# Patient Record
Sex: Female | Born: 1970 | Race: White | Hispanic: No | Marital: Married | Smoking: Former smoker
Health system: Southern US, Community
[De-identification: ages and names within clinical notes are randomized; demographics above are authoritative.]

## PROBLEM LIST (undated history)

## (undated) DIAGNOSIS — I10 Essential (primary) hypertension: Secondary | ICD-10-CM

## (undated) DIAGNOSIS — E119 Type 2 diabetes mellitus without complications: Secondary | ICD-10-CM

## (undated) DIAGNOSIS — N200 Calculus of kidney: Secondary | ICD-10-CM

## (undated) DIAGNOSIS — G35 Multiple sclerosis: Secondary | ICD-10-CM

## (undated) HISTORY — PX: TUBAL LIGATION: SHX77

---

## 2005-12-28 ENCOUNTER — Emergency Department: Payer: Self-pay | Admitting: Emergency Medicine

## 2008-10-30 ENCOUNTER — Emergency Department: Payer: Self-pay | Admitting: Emergency Medicine

## 2008-12-05 ENCOUNTER — Emergency Department: Payer: Self-pay | Admitting: Emergency Medicine

## 2009-08-16 ENCOUNTER — Emergency Department: Payer: Self-pay | Admitting: Emergency Medicine

## 2010-12-28 ENCOUNTER — Emergency Department: Payer: Self-pay | Admitting: *Deleted

## 2011-01-11 ENCOUNTER — Emergency Department: Payer: Self-pay | Admitting: Emergency Medicine

## 2013-03-10 HISTORY — PX: CARPAL TUNNEL RELEASE: SHX101

## 2014-04-25 ENCOUNTER — Encounter: Payer: Self-pay | Admitting: Podiatry

## 2014-04-25 ENCOUNTER — Ambulatory Visit (INDEPENDENT_AMBULATORY_CARE_PROVIDER_SITE_OTHER): Payer: PRIVATE HEALTH INSURANCE | Admitting: Podiatry

## 2014-04-25 ENCOUNTER — Ambulatory Visit (INDEPENDENT_AMBULATORY_CARE_PROVIDER_SITE_OTHER): Payer: PRIVATE HEALTH INSURANCE

## 2014-04-25 VITALS — BP 153/88 | HR 83 | Resp 16

## 2014-04-25 DIAGNOSIS — M722 Plantar fascial fibromatosis: Secondary | ICD-10-CM

## 2014-04-25 MED ORDER — TRIAMCINOLONE ACETONIDE 10 MG/ML IJ SUSP
10.0000 mg | Freq: Once | INTRAMUSCULAR | Status: AC
  Administered 2014-04-25: 10 mg

## 2014-04-25 NOTE — Progress Notes (Signed)
   Subjective:    Patient ID: Taylor Wyatt, female    DOB: 27-Oct-1970, 44 y.o.   MRN: [id number]  HPI Comments: "I have plantar fasciitis"  Patient c/o aching plantar heel right for about 3 months. She has had heel pain treated in 2013. AM pain. PCP Rx'd meloxicam-no better.  Foot Pain      Review of Systems  All other systems reviewed and are negative.      Objective:   Physical Exam        Assessment & Plan:

## 2014-04-25 NOTE — Patient Instructions (Signed)

## 2014-04-26 NOTE — Progress Notes (Signed)
Subjective:     Patient ID: Taylor Wyatt, female   DOB: 1970-09-07, 44 y.o.   MRN: [id number]  HPI patient states I've had a lot of pain in the bottom of my right heel for the last 3 months and I've tried reduced activity ice therapy and supportive shoe gears   Review of Systems     Objective:   Physical Exam Neurovascular status intact with muscle strength adequate and range of motion subtalar and midtarsal joint within normal limits. Patient is noted to have intense discomfort plantar aspect right heel at the insertional point of the tendon into the calcaneus with inflammation and fluid buildup at the insertion with moderate depression of the arch noted    Assessment:     Acute plantar fascia right is right with long-term history of plantar fasciitis    Plan:     H&P and condition discussed and today I injected the right plantar fascia 3 mg Kenalog 5 mg Xylocaine and dispensed fascial brace with instructions. Also placed on oral anti-inflammatory and gave instructions for physical therapy

## 2014-04-28 ENCOUNTER — Ambulatory Visit: Payer: Self-pay | Admitting: Podiatry

## 2014-05-05 ENCOUNTER — Ambulatory Visit (INDEPENDENT_AMBULATORY_CARE_PROVIDER_SITE_OTHER): Payer: PRIVATE HEALTH INSURANCE | Admitting: Podiatry

## 2014-05-05 ENCOUNTER — Encounter: Payer: Self-pay | Admitting: Podiatry

## 2014-05-05 VITALS — BP 157/88 | HR 68 | Resp 16

## 2014-05-05 DIAGNOSIS — M722 Plantar fascial fibromatosis: Secondary | ICD-10-CM

## 2014-05-05 NOTE — Progress Notes (Signed)
Subjective:     Patient ID: Taylor Wyatt, female   DOB: 07/26/1970, 44 y.o.   MRN: [id number]  HPI patient states my right heel feels quite a bit better with mild discomfort if I do a lot of walking but overall significantly improved   Review of Systems     Objective:   Physical Exam Neurovascular status intact with significant diminishment of discomfort medial band right heel at the insertional point of the tendon into the calcaneus    Assessment:     Improved plantar fasciitis right with diminished fluid buildup and diminished discomfort     Plan:     Advised on the importance of stretching and physical therapy and at this time I discussed shoe gear alternatives. Patient will be seen back if symptoms indicate

## 2014-06-16 ENCOUNTER — Encounter: Payer: Self-pay | Admitting: Podiatry

## 2014-06-16 ENCOUNTER — Ambulatory Visit (INDEPENDENT_AMBULATORY_CARE_PROVIDER_SITE_OTHER): Payer: PRIVATE HEALTH INSURANCE | Admitting: Podiatry

## 2014-06-16 VITALS — BP 147/85 | HR 81 | Resp 16

## 2014-06-16 DIAGNOSIS — M722 Plantar fascial fibromatosis: Secondary | ICD-10-CM | POA: Diagnosis not present

## 2014-06-16 MED ORDER — TRIAMCINOLONE ACETONIDE 10 MG/ML IJ SUSP
10.0000 mg | Freq: Once | INTRAMUSCULAR | Status: AC
  Administered 2014-06-16: 10 mg

## 2014-06-16 MED ORDER — DICLOFENAC SODIUM 75 MG PO TBEC: 75.0000 mg | DELAYED_RELEASE_TABLET | Freq: Two times a day (BID) | ORAL | Status: AC

## 2014-06-18 NOTE — Progress Notes (Signed)
Subjective:     Patient ID: Taylor Wyatt, female   DOB: Nov 26, 1970, 44 y.o.   MRN: [id number]  HPI patient presents stating my right heel continues to bother me around the medial band and especially is worse after sitting or getting up in the morning   Review of Systems     Objective:   Physical Exam Vascular status intact muscle strength adequate range of motion within normal limits with patient found to have inflammation and pain around the medial band right at the insertion of the tendon into the calcaneus. Patient has moderate depression of the arch    Assessment:     Continued significant plantar fasciitis right with inflammation worse after rest    Plan:     H&P and condition discussed with patient. Today I went ahead reinjected the plantar fascia 3 Milligan Kenalog 5 mill grams Xylocaine and dispensed night splint with instructions on usage

## 2014-07-07 ENCOUNTER — Encounter: Payer: Self-pay | Admitting: Podiatry

## 2014-07-07 ENCOUNTER — Ambulatory Visit (INDEPENDENT_AMBULATORY_CARE_PROVIDER_SITE_OTHER): Payer: PRIVATE HEALTH INSURANCE | Admitting: Podiatry

## 2014-07-07 VITALS — BP 149/77 | HR 68 | Resp 16

## 2014-07-07 DIAGNOSIS — M722 Plantar fascial fibromatosis: Secondary | ICD-10-CM | POA: Diagnosis not present

## 2014-07-08 NOTE — Progress Notes (Signed)
Subjective:     Patient ID: Taylor Wyatt, female   DOB: 04/19/1970, 44 y.o.   MRN: [id number]  HPI patient states that her heel has improved quite a bit   Review of Systems     Objective:   Physical Exam Neurovascular status intact with diminishment of discomfort plantar aspect right heel at the insertional point of the tendon into the calcaneus    Assessment:     Plantar fasciitis right improved    Plan:     Advised on physical therapy anti-inflammatories and shoe gear modification. Reappoint as needed

## 2014-11-14 ENCOUNTER — Emergency Department: Payer: No Typology Code available for payment source

## 2014-11-14 ENCOUNTER — Emergency Department (HOSPITAL_COMMUNITY): Payer: Self-pay

## 2014-11-14 ENCOUNTER — Emergency Department
Admission: EM | Admit: 2014-11-14 | Discharge: 2014-11-14 | Disposition: A | Discharge status: Home / Self Care | Payer: No Typology Code available for payment source | Attending: Emergency Medicine | Admitting: Emergency Medicine

## 2014-11-14 ENCOUNTER — Encounter: Payer: Self-pay | Admitting: Intensive Care

## 2014-11-14 DIAGNOSIS — Z87891 Personal history of nicotine dependence: Secondary | ICD-10-CM | POA: Insufficient documentation

## 2014-11-14 DIAGNOSIS — Y92410 Unspecified street and highway as the place of occurrence of the external cause: Secondary | ICD-10-CM | POA: Insufficient documentation

## 2014-11-14 DIAGNOSIS — I10 Essential (primary) hypertension: Secondary | ICD-10-CM | POA: Insufficient documentation

## 2014-11-14 DIAGNOSIS — Y998 Other external cause status: Secondary | ICD-10-CM | POA: Diagnosis not present

## 2014-11-14 DIAGNOSIS — S161XXA Strain of muscle, fascia and tendon at neck level, initial encounter: Secondary | ICD-10-CM | POA: Diagnosis not present

## 2014-11-14 DIAGNOSIS — Z792 Long term (current) use of antibiotics: Secondary | ICD-10-CM | POA: Insufficient documentation

## 2014-11-14 DIAGNOSIS — Y9389 Activity, other specified: Secondary | ICD-10-CM | POA: Insufficient documentation

## 2014-11-14 DIAGNOSIS — S199XXA Unspecified injury of neck, initial encounter: Secondary | ICD-10-CM | POA: Diagnosis present

## 2014-11-14 HISTORY — DX: Calculus of kidney: N20.0

## 2014-11-14 HISTORY — DX: Essential (primary) hypertension: I10

## 2014-11-14 MED ORDER — ACETAMINOPHEN 500 MG PO TABS
1000.0000 mg | ORAL_TABLET | ORAL | Status: AC
  Administered 2014-11-14: 1000 mg via ORAL
  Filled 2014-11-14: qty 2

## 2014-11-14 MED ORDER — CYCLOBENZAPRINE HCL 10 MG PO TABS: 10.0000 mg | ORAL_TABLET | Freq: Three times a day (TID) | ORAL | Status: AC | PRN
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
## 2018-02-25 ENCOUNTER — Emergency Department: Payer: 59

## 2018-02-25 ENCOUNTER — Encounter: Payer: Self-pay | Admitting: Emergency Medicine

## 2018-02-25 ENCOUNTER — Emergency Department
Admission: EM | Admit: 2018-02-25 | Discharge: 2018-02-25 | Disposition: A | Discharge status: Home / Self Care | Payer: 59 | Attending: Emergency Medicine | Admitting: Emergency Medicine

## 2018-02-25 ENCOUNTER — Other Ambulatory Visit: Payer: Self-pay

## 2018-02-25 DIAGNOSIS — W182XXA Fall in (into) shower or empty bathtub, initial encounter: Secondary | ICD-10-CM | POA: Diagnosis not present

## 2018-02-25 DIAGNOSIS — Y9389 Activity, other specified: Secondary | ICD-10-CM | POA: Diagnosis not present

## 2018-02-25 DIAGNOSIS — I10 Essential (primary) hypertension: Secondary | ICD-10-CM | POA: Insufficient documentation

## 2018-02-25 DIAGNOSIS — Y929 Unspecified place or not applicable: Secondary | ICD-10-CM | POA: Diagnosis not present

## 2018-02-25 DIAGNOSIS — S0083XA Contusion of other part of head, initial encounter: Secondary | ICD-10-CM | POA: Insufficient documentation

## 2018-02-25 DIAGNOSIS — Y999 Unspecified external cause status: Secondary | ICD-10-CM | POA: Diagnosis not present

## 2018-02-25 DIAGNOSIS — Z87891 Personal history of nicotine dependence: Secondary | ICD-10-CM | POA: Diagnosis not present

## 2018-02-25 DIAGNOSIS — S0993XA Unspecified injury of face, initial encounter: Secondary | ICD-10-CM | POA: Diagnosis present

## 2018-02-25 MED ORDER — MELOXICAM 15 MG PO TABS: 15.0000 mg | ORAL_TABLET | Freq: Every day | ORAL | 0 refills | Status: AC

## 2018-02-25 NOTE — ED Notes (Signed)
Patient reports falling in shower on Tuesday, hitting the left side of her face and left shoulder. Denies LOC. Denies visual disturbance or pain with eye movement. Dark purple bruising noted around left eye. Patient able to move left arm freely.

## 2018-02-25 NOTE — ED Triage Notes (Signed)
Pt had mechanical fall in the shower on Tuesday. Pt denies loc. [facility]. [facility] sent pt to ED to have CT of the face to assess for broken bones.

## 2018-02-25 NOTE — ED Notes (Signed)
Patient discharged to home per MD order. Patient in stable condition, and deemed medically cleared by ED provider for discharge. Discharge instructions reviewed with patient/family using "Teach Back"; verbalized understanding of medication education and administration, and information about follow-up care. Denies further concerns. ° °

## 2018-02-25 NOTE — ED Provider Notes (Signed)
[facility] Emergency Department Provider Note  ____________________________________________  Time seen: Approximately 7:41 PM  I have reviewed the triage vital signs and the nursing notes.   HISTORY  Chief Complaint Fall    HPI Taylor Wyatt is a 47 y.o. female presents to the emergency department with ecchymosis around left eye after patient reportedly tripped and fell in the shower 3 days ago.  Patient did not lose consciousness.  She has not been experiencing any pain with extraocular eye muscle movements.  She denies current headache or neck pain.  No changes in vision.  No numbness or tingling in the upper or lower extremities.  She denies chest tightness, chest pain, shortness of breath, nausea, vomiting abdominal pain.  She has had no difficulty ambulating.  Patient has had facial tenderness and became concerned for fracture.  She was referred to the ED from urgent care.   Past Medical History:  Diagnosis Date  . Hypertension   . Kidney stones     There are no active problems to display for this patient.   Past Surgical History:  Procedure Laterality Date  . CESAREAN SECTION    . TUBAL LIGATION      Prior to Admission medications   Medication Sig Start Date End Date Taking? Authorizing Provider  diclofenac (VOLTAREN) 75 MG EC tablet Take 1 tablet (75 mg total) by mouth 2 (two) times daily. 06/16/14   Lenn Sinkegal, Norman S, DPM  meloxicam (MOBIC) 15 MG tablet Take 1 tablet (15 mg total) by mouth daily. 02/25/18 02/25/19  Orvil FeilWoods, Debra Calabretta M, PA-C    Allergies Lisinopril  No family history on file.  Social History Social History   Tobacco Use  . Smoking status: Former Games developermoker  . Smokeless tobacco: Never Used  Substance Use Topics  . Alcohol use: No    Alcohol/week: 0.0 standard drinks  . Drug use: No     Review of Systems  Constitutional: No fever/chills Eyes: No visual changes. No discharge ENT: No upper respiratory  complaints. Cardiovascular: no chest pain. Respiratory: no cough. No SOB. Gastrointestinal: No abdominal pain.  No nausea, no vomiting.  No diarrhea.  No constipation. Genitourinary: Negative for dysuria. No hematuria Musculoskeletal: Negative for musculoskeletal pain. Skin: Negative for rash, abrasions, lacerations, ecchymosis. Neurological: Negative for headaches, focal weakness or numbness.   ____________________________________________   PHYSICAL EXAM:  VITAL SIGNS: ED Triage Vitals  Enc Vitals Group     BP 02/25/18 1740 (!) 150/78     Pulse Rate 02/25/18 1740 75     Resp 02/25/18 1740 18     Temp 02/25/18 1740 98.4 F (36.9 C)     Temp Source 02/25/18 1740 Oral     SpO2 02/25/18 1740 98 %     Weight 02/25/18 1741 238 lb (108 kg)     Height 02/25/18 1741 5\' 6"  (1.676 m)     Head Circumference --      Peak Flow --      Pain Score 02/25/18 1741 3     Pain Loc --      Pain Edu? --      Excl. in GC? --      Constitutional: Alert and oriented. Well appearing and in no acute distress. Eyes: Conjunctivae are normal. PERRL. EOMI. no pain is elicited with extraocular eye muscle movement.  Patient does have tenderness with palpation of the left inferior orbit and zygomatic arch. Head: Atraumatic.  Patient has facial bruising. ENT:      Ears:  TMs are pearly.      Nose: No congestion/rhinnorhea.      Mouth/Throat: Mucous membranes are moist.  Neck: No stridor.  No cervical spine tenderness to palpation. Hematological/Lymphatic/Immunilogical: No cervical lymphadenopathy. Cardiovascular: Normal rate, regular rhythm. Normal S1 and S2.  Good peripheral circulation. Respiratory: Normal respiratory effort without tachypnea or retractions. Lungs CTAB. Good air entry to the bases with no decreased or absent breath sounds. Gastrointestinal: Bowel sounds 4 quadrants. Soft and nontender to palpation. No guarding or rigidity. No palpable masses. No distention. No CVA  tenderness. Musculoskeletal: Full range of motion to all extremities. No gross deformities appreciated. Neurologic:  Normal speech and language. No gross focal neurologic deficits are appreciated.  Skin:  Skin is warm, dry and intact. No rash noted. Psychiatric: Mood and affect are normal. Speech and behavior are normal. Patient exhibits appropriate insight and judgement.   ____________________________________________   LABS (all labs ordered are listed, but only abnormal results are displayed)  Labs Reviewed - No data to display ____________________________________________  EKG   ____________________________________________  RADIOLOGY I personally viewed and evaluated these images as part of my medical decision making, as well as reviewing the written report by the radiologist.  Ct Maxillofacial Wo Contrast  Result Date: 02/25/2018 CLINICAL DATA:  47 y/o  F; fall with injury to left eye. EXAM: CT MAXILLOFACIAL WITHOUT CONTRAST TECHNIQUE: Multidetector CT imaging of the maxillofacial structures was performed. Multiplanar CT image reconstructions were also generated. COMPARISON:  None. FINDINGS: Osseous: No fracture or mandibular dislocation. No destructive process. Orbits: No traumatic or inflammatory finding of the orbital compartments. Sinuses: Clear. Soft tissues: Edema of the superficial left periorbital and facial soft tissues compatible with contusion. No hematoma. Limited intracranial: No significant or unexpected finding. IMPRESSION: Left periorbital and facial superficial soft tissue contusion. No hematoma. No facial fracture. Electronically Signed   By: Mitzi HansenLance  Furusawa-Stratton M.D.   On: 02/25/2018 18:35    ____________________________________________    PROCEDURES  Procedure(s) performed:    Procedures    Medications - No data to display   ____________________________________________   INITIAL IMPRESSION / ASSESSMENT AND PLAN / ED COURSE  Pertinent labs &  imaging results that were available during my care of the patient were reviewed by me and considered in my medical decision making (see chart for details).  Review of the [place] CSRS was performed in accordance of the [facility] prior to dispensing any controlled drugs.      Assessment and Plan:  Facial contusion Patient presents to the emergency department with left-sided facial pain after a fall that occurred 3 days ago.  CT maxillofacial revealed no acute abnormality.  Overall physical exam was reassuring.  Ice application was recommended.  Patient was discharged with meloxicam after she assured me that she has no history of prior GI bleed.  Vital signs were reassuring prior to discharge.  All patient questions were answered.  ____________________________________________  FINAL CLINICAL IMPRESSION(S) / ED DIAGNOSES  Final diagnoses:  Contusion of face, initial encounter      NEW MEDICATIONS STARTED DURING THIS VISIT:  ED Discharge Orders         Ordered    meloxicam (MOBIC) 15 MG tablet  Daily     02/25/18 1914              This chart was dictated using voice recognition software/Dragon. Despite best efforts to proofread, errors can occur which can change the meaning. Any change was purely unintentional.    Orvil FeilWoods, Adia Crammer M, PA-C 02/25/18  4098    Arnaldo Natal, MD 02/26/18 2306

## 2018-05-19 ENCOUNTER — Other Ambulatory Visit: Payer: Self-pay

## 2018-05-19 ENCOUNTER — Ambulatory Visit
Payer: Managed Care, Other (non HMO) | Attending: Neurology | Admitting: Rehabilitative and Restorative Service Providers"

## 2018-05-19 DIAGNOSIS — M6281 Muscle weakness (generalized): Secondary | ICD-10-CM | POA: Diagnosis present

## 2018-05-19 DIAGNOSIS — R2689 Other abnormalities of gait and mobility: Secondary | ICD-10-CM | POA: Diagnosis not present

## 2018-05-19 DIAGNOSIS — R29818 Other symptoms and signs involving the nervous system: Secondary | ICD-10-CM | POA: Diagnosis present

## 2018-05-19 NOTE — Patient Instructions (Signed)
Access Code: [email or web address]  URL: [email or web address]  Date: 05/19/2018  Prepared by: Margretta Ditty   Exercises Standing Gastroc Stretch - 3 reps - 1 sets - 30 seconds hold - 2x daily - 7x weekly Seated Hamstring Stretch with Chair - 3 reps - 1 sets - 30 seconds hold - 2x daily - 7x weekly

## 2018-05-20 NOTE — Therapy (Signed)
[facility] [address] [place], [place], [address] Phone: [phone]   Fax:  [phone]  Physical Therapy Evaluation  Patient Details  Name: Taylor Wyatt MRN: [id number] Date of Birth: 1970/08/10 Referring Provider (PT): Shirlean Schlein, MD   Encounter Date: 05/19/2018  PT End of Session - 05/20/18 1404    Visit Number  1    Number of Visits  12    Date for PT Re-Evaluation  07/19/18    Authorization Type  [employer] managed care *contacted patient regarding health plan as unsure if we are covered in plan    PT Start Time  0845    PT Stop Time  0930    PT Time Calculation (min)  45 min    Activity Tolerance  Patient tolerated treatment well    Behavior During Therapy  [facility] for tasks assessed/performed       Past Medical History:  Diagnosis Date  . Hypertension   . Kidney stones     Past Surgical History:  Procedure Laterality Date  . CESAREAN SECTION    . TUBAL LIGATION      There were no vitals filed for this visit.   Subjective Assessment - 05/19/18 0856    Subjective  The patient was diagnosed with MS 3-4 years ago.  Initial symptoms included hand numbness and optic neuritis.  The patient's cc:  imbalance with falls (6x in last 6 months), weakness, fatigue.   She notes when she gets hot at work, she notes she trips more (has foot drop) and this leads to falls.    She also notes a h/o plantar fascitis and gets foot pain while working.    Pertinent History  Multiple Sclerosis, carpal tunnel release surgery 2015,    How long can you walk comfortably?  Patient works for 2, 5 hour shifts during the week.      Patient Stated Goals  "I just want to be able to walk again."  She notes she worries about tripping.      Currently in Pain?  Yes    Pain Score  --   she has pain in R wrist and bilateral knees from falling last week   Aggravating Factors   worse due to falls    Pain Relieving Factors  rest,  time    Effect of Pain on Daily Activities  Also tenderness with palpation of the R hip (glut medius region).         [facility] PT Assessment - 05/19/18 0900      Assessment   Medical Diagnosis  Multiple Sclerosis, fatigue and walking instability    Referring Provider (PT)  Shirlean Schlein, MD    Onset Date/Surgical Date  --   initial onset 3-4 years ago   Hand Dominance  Right    Prior Therapy  none      Precautions   Precautions  Fall    Precaution Comments  *fatigues with heat      Restrictions   Weight Bearing Restrictions  No      Balance Screen   Has the patient fallen in the past 6 months  Yes    How many times?  6    Has the patient had a decrease in activity level because of a fear of falling?   Yes   works, but reduced hours due to weakness, falls   Is the patient reluctant to leave their home because of a fear of  falling?   No      Home Environment   Living Environment  Private residence    Living Arrangements  Spouse/significant other    Type of Home  House    Home Access  Stairs to enter    Entrance Stairs-Number of Steps  4    Entrance Stairs-Rails  Left    Home Layout  One level    Home Equipment  --   hiking sticks (hasn't used them a lot due to falls)     Prior Function   Level of Independence  Independent      Sensation   Light Touch  Impaired Detail    Light Touch Impaired Details  Impaired LLE    Additional Comments  Has bilateral foot pain-- has been treated for plantar fascitis.  She wears TENS unit to reduce foot pain.  All of her pain is in her heels.  Plantar fascitis x 10+ years, has had multiple injections.      ROM / Strength   AROM / PROM / Strength  AROM;Strength      AROM   Overall AROM Comments  WFLs      Strength   Overall Strength  Deficits    Strength Assessment Site  Shoulder;Elbow;Hand;Hip;Knee;Ankle    Right/Left Shoulder  Right;Left    Right Shoulder Flexion  5/5    Right Shoulder ABduction  5/5    Left Shoulder  Flexion  5/5    Left Shoulder ABduction  5/5    Right/Left Elbow  Right;Left    Right Elbow Flexion  5/5    Right Elbow Extension  5/5    Left Elbow Flexion  5/5    Left Elbow Extension  5/5    Right/Left hand  Right;Left    Right Hand Grip (lbs)  85    Left Hand Grip (lbs)  78    Right/Left Hip  Right;Left    Right Hip Flexion  3/5    Right Hip ABduction  3/5    Left Hip Flexion  3/5    Left Hip ABduction  2+/5    Right/Left Knee  Right;Left    Right Knee Flexion  5/5    Right Knee Extension  5/5    Left Knee Flexion  5/5    Left Knee Extension  5/5    Right/Left Ankle  Right;Left    Right Ankle Dorsiflexion  4+/5    Left Ankle Dorsiflexion  3+/5      Flexibility   Soft Tissue Assessment /Muscle Length  yes    Hamstrings  bilateral general tightness; L anterior hip capsule tightness with dec'd ROM in Faber test L, R painful in glut medius    Quadriceps  tightness noted in sidelying with hip extension + knee flexion      Ambulation/Gait   Ambulation/Gait  Yes    Ambulation/Gait Assistance  6: Modified independent (Device/Increase time)    Ambulation Distance (Feet)  200 Feet    Assistive device  None    Ambulation Surface  Level;Indoor    Gait Comments  R antalgic gait pattern, decreased bilat heel strike.       Standardized Balance Assessment   Standardized Balance Assessment  Berg Balance Test      Berg Balance Test   Sit to Stand  Able to stand without using hands and stabilize independently    Standing Unsupported  Able to stand safely 2 minutes    Sitting with Back Unsupported but Feet Supported on Floor  or Stool  Able to sit safely and securely 2 minutes    Stand to Sit  Sits safely with minimal use of hands    Transfers  Able to transfer safely, minor use of hands    Standing Unsupported with Eyes Closed  Able to stand 10 seconds with supervision    Standing Unsupported with Feet Together  Able to place feet together independently and stand for 1 minute with  supervision    From Standing, Reach Forward with Outstretched Arm  Can reach confidently >25 cm (10")    From Standing Position, Pick up Object from Floor  Able to pick up shoe safely and easily    From Standing Position, Turn to Look Behind Over each Shoulder  Looks behind from both sides and weight shifts well    Turn 360 Degrees  Needs close supervision or verbal cueing    Standing Unsupported, Alternately Place Feet on Step/Stool  Able to complete >2 steps/needs minimal assist    Standing Unsupported, One Foot in Front  Able to take small step independently and hold 30 seconds    Standing on One Leg  Tries to lift leg/unable to hold 3 seconds but remains standing independently    Total Score  43    Berg comment:  43/56                Objective measurements completed on examination: See above findings.              PT Education - 05/20/18 1357    Education Details  HEP intiiated for LE stretching.    Person(s) Educated  Patient    Methods  Explanation;Demonstration;Handout    Comprehension  Returned demonstration;Verbalized understanding       PT Short Term Goals - 05/20/18 1427      PT SHORT TERM GOAL #1   Title  The patient will be indep with HEP for LE strengthening, flexibility, balance and general conditioning.    Time  4    Period  Weeks    Target Date  06/16/18      PT SHORT TERM GOAL #2   Title  The patient will improve Berg score from 43/56 to > or equal to 48/56 to demo dec'd risk for falls.    Time  4    Period  Weeks    Target Date  06/16/18      PT SHORT TERM GOAL #3   Title  The patient will verbalize understanding of energy conservation ([employer] Society handout) for management of fatigue.    Time  4    Period  Weeks    Target Date  06/16/18      PT SHORT TERM GOAL #4   Title  The patient will be provided information on vocation rehab opportunities as she notes she would like to be able to work more, but is limited in current job.    Time   4    Period  Weeks    Target Date  06/16/18        PT Long Term Goals - 05/20/18 1430      PT LONG TERM GOAL #1   Title  The patient will be indep with progression of HEP.    Time  8    Period  Weeks    Target Date  07/14/18      PT LONG TERM GOAL #2   Title  The patient will improve Berg score from 43/56 to > or  equal to 52/56 to demo improving high level balance.    Time  8    Period  Weeks    Target Date  07/14/18      PT LONG TERM GOAL #3   Title  The patient will report ability to work x 5 hours with reduction in bilateral foot pain.    Time  8    Period  Weeks    Target Date  07/14/18      PT LONG TERM GOAL #4   Title  The patient will verbalize understanding of use of cooling vest in order to tolerate warmer environment in her work setting.    Time  8    Period  Weeks    Target Date  07/14/18      PT LONG TERM GOAL #5   Title  The patient will verbalize understanding of community wellness opportunities.    Time  8    Period  Weeks    Target Date  07/14/18             Plan - 05/20/18 1444    Clinical Impression Statement  The patient is a 48 yo female presenting to outpatient physical therapy with dx of MS for fatigue and walking instability.  The patient reports tripping and foot drop that hinder gait, worse when fatigued or overheated.  She has impaired LE strength in hips and ankles and dec'd high level balance.  In addition to MS, she notes chronic h/o plantar fasciitis.  PT to progress mobility to optimize functional status.     Personal Factors and Comorbidities  Comorbidity 1;Comorbidity 2    Comorbidities  MS, plantar fascitis    Examination-Activity Limitations  Locomotion Level    Examination-Participation Restrictions  Other;Community Activity   Work activities, continues to reduce hours   Clinical Decision Making  Moderate    Rehab Potential  Good    PT Frequency  --   2x/week x 4 weeks, 1x/week x 4 weeks   PT Treatment/Interventions   ADLs/Self Care Home Management;Therapeutic activities;Moist Heat;Therapeutic exercise;Manual techniques;Functional mobility training;Stair training;Gait training;Neuromuscular re-education;Balance training;Patient/family education;Taping;Energy conservation;Electrical Stimulation    PT Next Visit Plan  Check gait speed for baseline; Discuss cooling vest for work use due to hot environment; LE strengthening (hips and ankles).  *will consider AFOs for work if she continues with foot drop, but would rather get strengthening and cooling to avoid compensatory bracing.    Consulted and Agree with Plan of Care  Patient       Patient will benefit from skilled therapeutic intervention in order to improve the following deficits and impairments:  Abnormal gait, Decreased endurance, Decreased activity tolerance, Decreased balance, Decreased mobility, Decreased strength, Pain, Impaired flexibility  Visit Diagnosis: Other abnormalities of gait and mobility  Muscle weakness (generalized)  Other symptoms and signs involving the nervous system     Problem List There are no active problems to display for this patient.   Aaradhya Kysar, PT 05/20/2018, 3:03 PM  [facility] [address] [place], [place], [address] Phone: [phone]   Fax:  [phone]  Name: Taylor Wyatt MRN: [id number] Date of Birth: 11-Dec-1970

## 2018-05-25 ENCOUNTER — Ambulatory Visit: Payer: Managed Care, Other (non HMO) | Admitting: Physical Therapy

## 2018-05-26 ENCOUNTER — Ambulatory Visit: Payer: Managed Care, Other (non HMO) | Admitting: Physical Therapy

## 2018-06-01 ENCOUNTER — Ambulatory Visit: Payer: Managed Care, Other (non HMO) | Admitting: Physical Therapy

## 2018-06-01 ENCOUNTER — Telehealth: Payer: Self-pay | Admitting: Rehabilitative and Restorative Service Providers"

## 2018-06-01 NOTE — Telephone Encounter (Signed)
LVM:  Regarding clinic closure secondary to covid-19 Taylor Wyatt, PT

## 2018-06-03 ENCOUNTER — Ambulatory Visit: Payer: Managed Care, Other (non HMO) | Admitting: Physical Therapy

## 2018-06-08 ENCOUNTER — Ambulatory Visit: Payer: No Typology Code available for payment source | Admitting: Physical Therapy

## 2018-06-09 ENCOUNTER — Ambulatory Visit: Payer: No Typology Code available for payment source | Admitting: Rehabilitative and Restorative Service Providers"

## 2018-06-11 ENCOUNTER — Ambulatory Visit: Payer: No Typology Code available for payment source | Admitting: Physical Therapy

## 2018-06-21 ENCOUNTER — Ambulatory Visit: Payer: No Typology Code available for payment source | Admitting: Rehabilitative and Restorative Service Providers"

## 2018-06-23 ENCOUNTER — Ambulatory Visit: Payer: No Typology Code available for payment source | Admitting: Rehabilitative and Restorative Service Providers"

## 2020-03-30 IMAGING — CT CT MAXILLOFACIAL W/O CM
3 series · 15 of 47 positions shown, 18 images · non-contrast
Comparison: None.

CLINICAL DATA: 47 y/o  F; fall with injury to left eye.

EXAM:
CT MAXILLOFACIAL WITHOUT CONTRAST
TECHNIQUE: Multidetector CT imaging of the maxillofacial structures was
performed. Multiplanar CT image reconstructions were also generated.

[Series 2: max soft · axial · 0.32mm/px · z∈[-242,-106]mm · 9 of 80 slices shown, 12 images]
[im 6/80  brain]
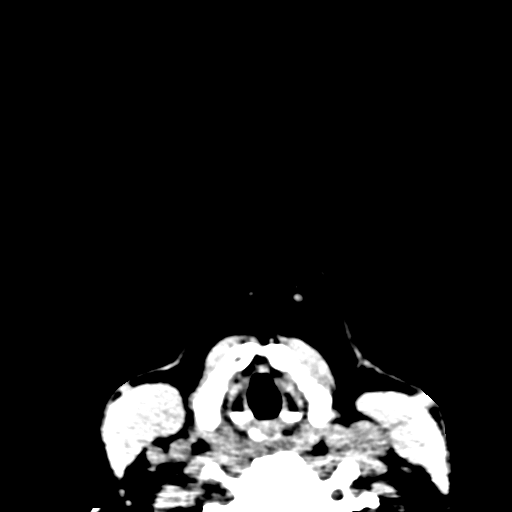
[im 6/80  bone]
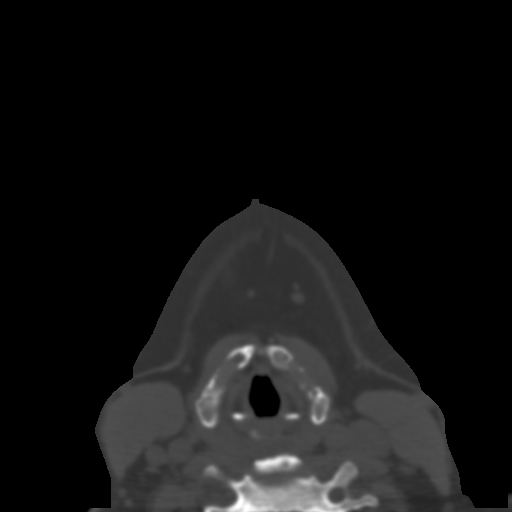
[im 14/80  bone]
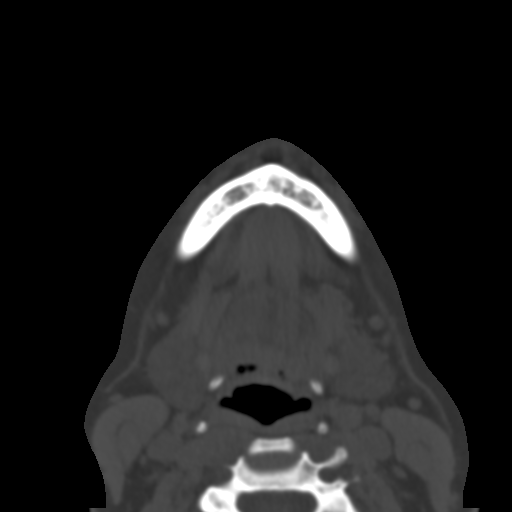
[im 22/80  bone]
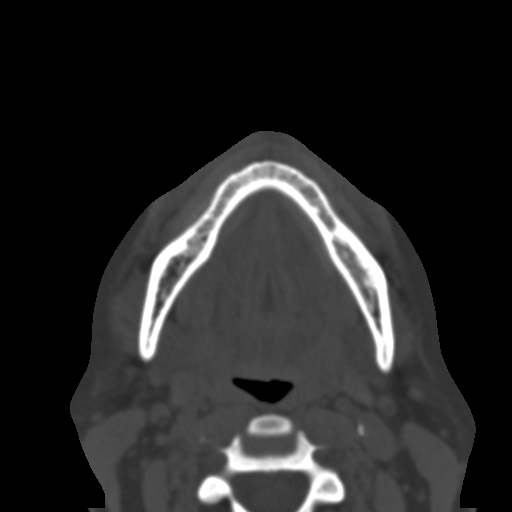
[im 30/80  bone]
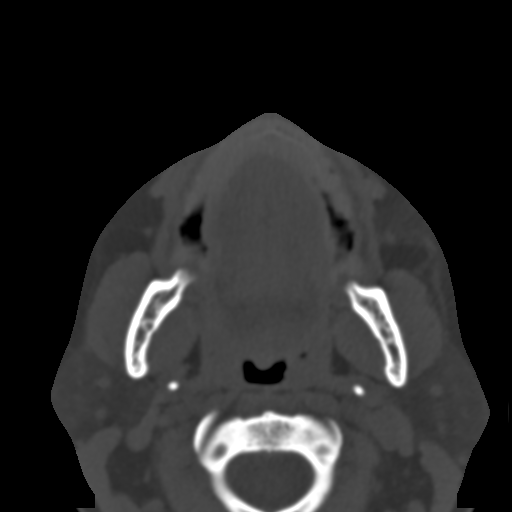
[im 41/80  brain]
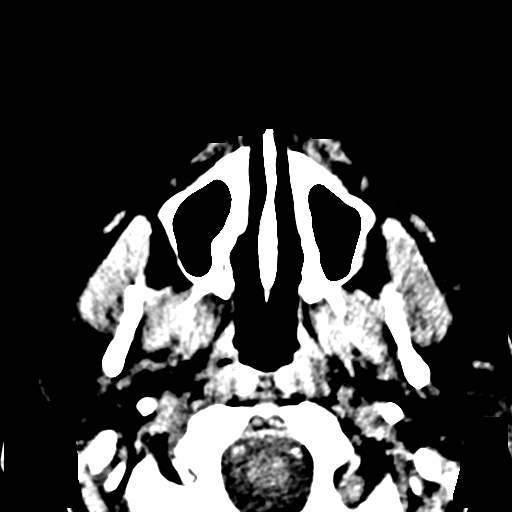
[im 41/80  bone]
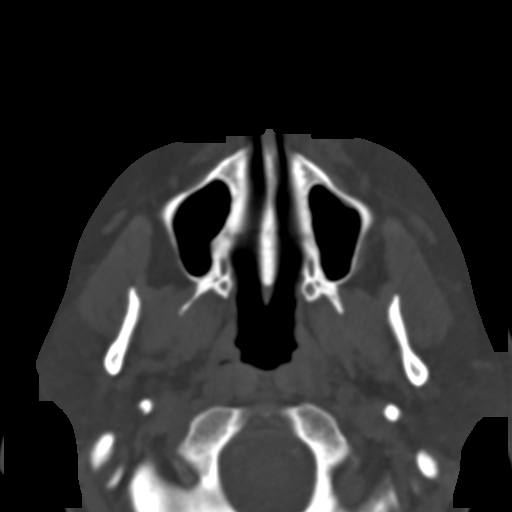
[im 50/80  bone]
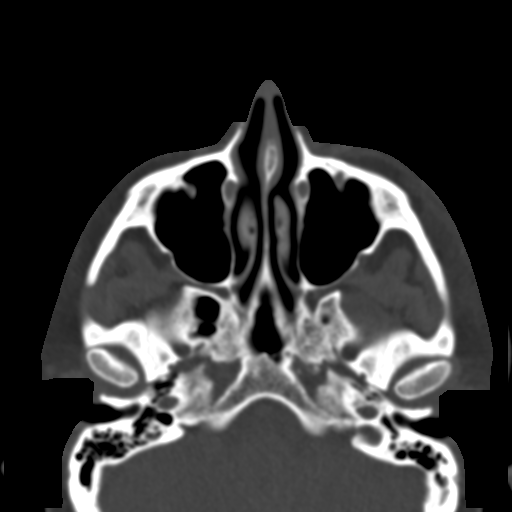
[im 58/80  bone]
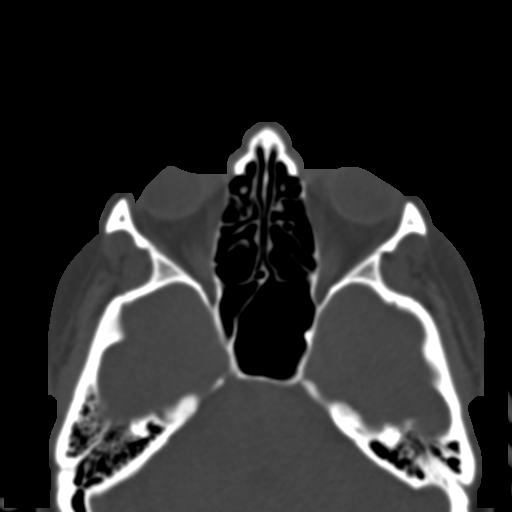
[im 66/80  bone]
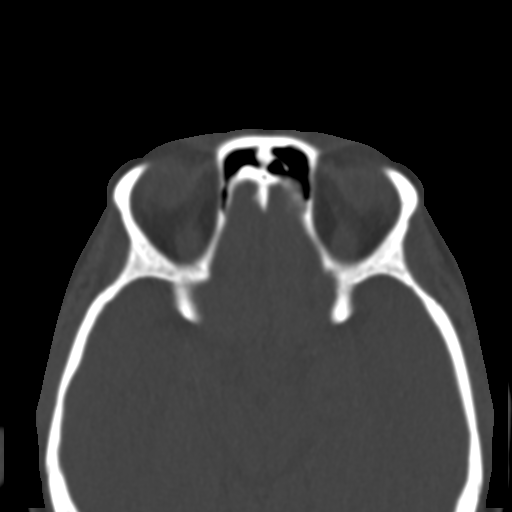
[im 74/80  brain]
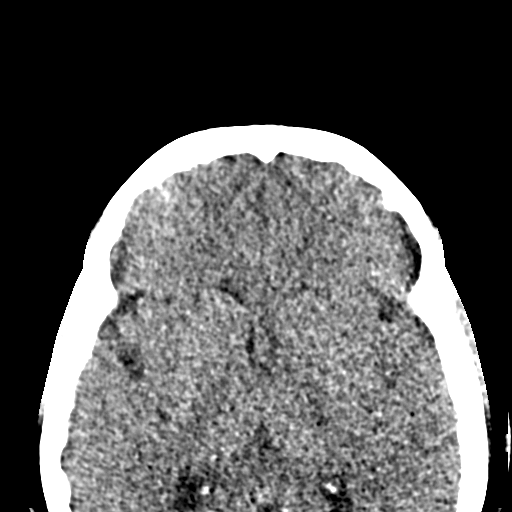
[im 74/80  bone]
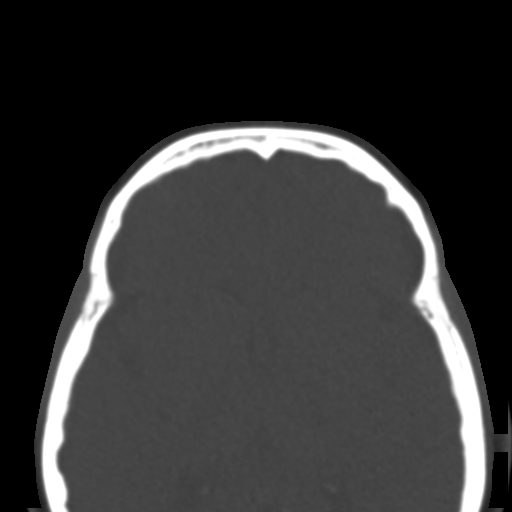

[Series 6: coronal soft · coronal · 0.31mm/px · 3 of 103 slices shown]
[im 35/103  bone]
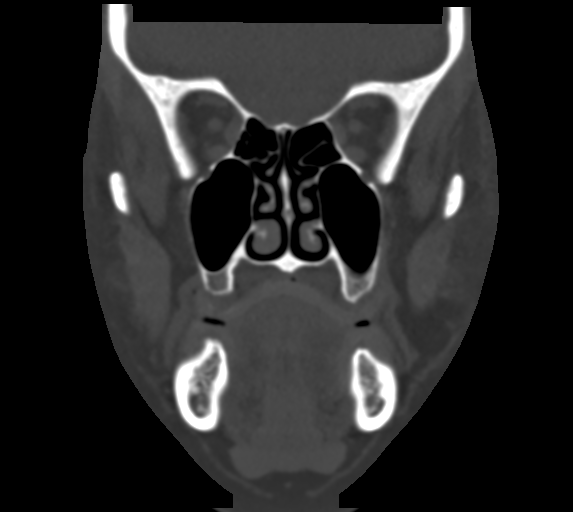
[im 46/103  bone]
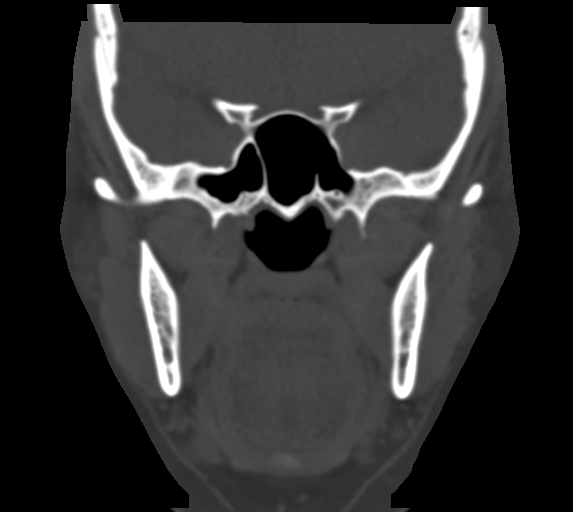
[im 57/103  bone]
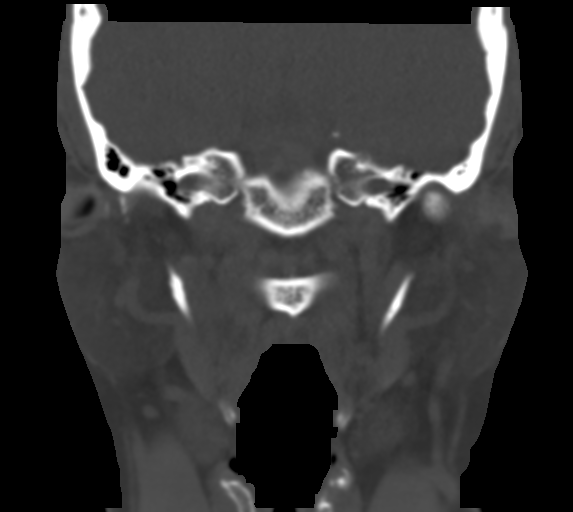

[Series 7: sagittal soft · sagittal · 0.31mm/px · 3 of 83 slices shown]
[im 28/83  bone]
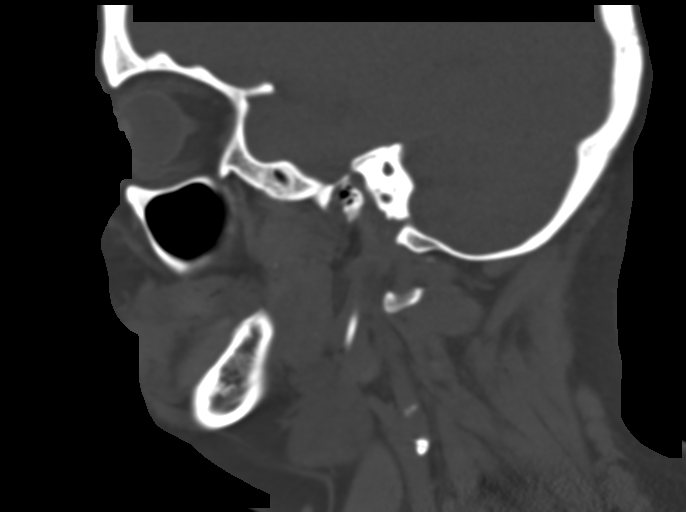
[im 42/83  bone]
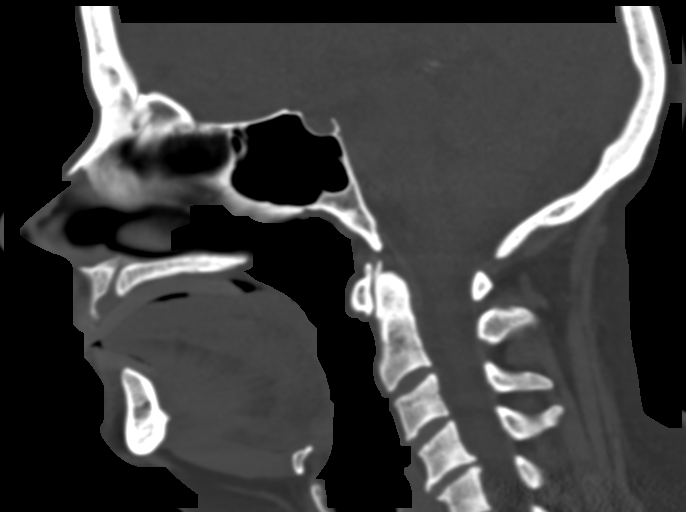
[im 55/83  bone]
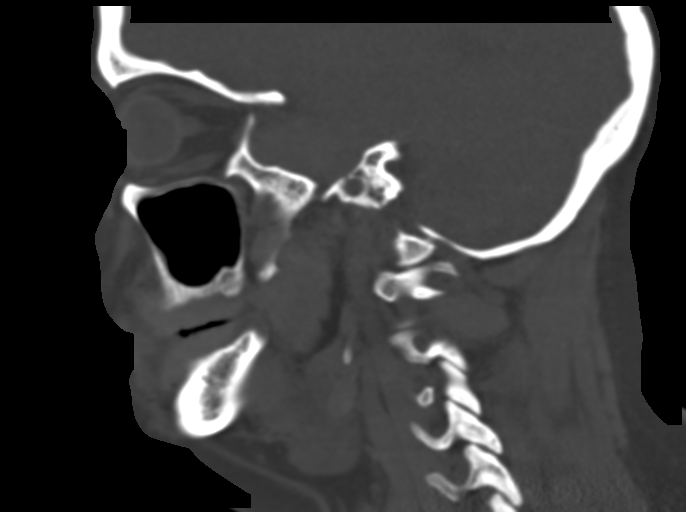

[15 of 47 positions shown; findings below may reference images not displayed]

FINDINGS: Osseous: No fracture or mandibular dislocation. No destructive
process.

Orbits: No traumatic or inflammatory finding of the orbital
compartments.

Sinuses: Clear.

Soft tissues: Edema of the superficial left periorbital and facial
soft tissues compatible with contusion. No hematoma.

Limited intracranial: No significant or unexpected finding.
IMPRESSION: Left periorbital and facial superficial soft tissue contusion. No
hematoma. No facial fracture.

## 2021-09-03 ENCOUNTER — Other Ambulatory Visit: Payer: Self-pay | Admitting: Obstetrics and Gynecology
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
## 2021-09-20 ENCOUNTER — Encounter
Admission: RE | Admit: 2021-09-20 | Discharge: 2021-09-20 | Disposition: A | Discharge status: Home / Self Care | Payer: No Typology Code available for payment source | Source: Ambulatory Visit | Attending: Obstetrics and Gynecology | Admitting: Obstetrics and Gynecology

## 2021-09-20 VITALS — Ht 65.5 in | Wt 233.0 lb

## 2021-09-20 DIAGNOSIS — E119 Type 2 diabetes mellitus without complications: Secondary | ICD-10-CM

## 2021-09-20 DIAGNOSIS — Z01812 Encounter for preprocedural laboratory examination: Secondary | ICD-10-CM

## 2021-09-20 DIAGNOSIS — I10 Essential (primary) hypertension: Secondary | ICD-10-CM

## 2021-09-20 HISTORY — DX: Multiple sclerosis: G35

## 2021-09-20 HISTORY — DX: Type 2 diabetes mellitus without complications: E11.9

## 2021-09-20 NOTE — Patient Instructions (Addendum)
Your procedure is scheduled on: Friday September 27, 2021. Report to Day Surgery inside [facility] 2nd floor, stop by admissions desk before getting on elevator.  To find out your arrival time please call [phone] between 1PM - 3PM on Thursday September 26, 2021.  Remember: Instructions that are not followed completely may result in serious medical risk,  up to and including death, or upon the discretion of your surgeon and anesthesiologist your  surgery may need to be rescheduled.     _X__ 1. Do not eat food after midnight the night before your procedure.                 No chewing gum or hard candies. You may drink clear liquids up to 2 hours                 before you are scheduled to arrive for your surgery- DO not drink clear                 liquids within 2 hours of the start of your surgery.                 Clear Liquids include:  water,  Black Coffee or Tea (Do not add                 anything to coffee or tea).  __X__2.   Complete the "Ensure Clear Pre-surgery Clear Carbohydrate Drink" provided to you, 2 hours before arrival. **If you are diabetic you will be provided with an alternative drink, Gatorade Zero or G2.  __X__3.  On the morning of surgery brush your teeth with toothpaste and water, you                may rinse your mouth with mouthwash if you wish.  Do not swallow any toothpaste or mouthwash.     _X__ 4.  No Alcohol for 24 hours before or after surgery.   _X__ 5.  Do Not Smoke or use e-cigarettes For 24 Hours Prior to Your Surgery.                 Do not use any chewable tobacco products for at least 6 hours prior to                 Surgery.  _X__  6.  Do not use any recreational drugs (marijuana, cocaine, heroin, ecstasy, MDMA or other)                For at least one week prior to your surgery.  Combination of these drugs with anesthesia                May have life threatening results.  ____  7.  Bring all medications with you on the day of  surgery if instructed.   __X__  8.  Notify your doctor if there is any change in your medical condition      (cold, fever, infections).     Do not wear jewelry, make-up, hairpins, clips or nail polish. Do not wear lotions, powders, or perfumes. You may wear deodorant. Do not shave 48 hours prior to surgery.  Do not bring valuables to the hospital.    [facility] is not responsible for any belongings or valuables.  Contacts, dentures or bridgework may not be worn into surgery. Leave your suitcase in the car. After surgery it may be brought to your room. For patients admitted to the hospital, discharge  time is determined by your treatment team.   Patients discharged the day of surgery will not be allowed to drive home.   Make arrangements for someone to be with you for the first 24 hours of your Same Day Discharge.   __X__ Take these medicines the morning of surgery with A SIP OF WATER:    1. DULoxetine (CYMBALTA) 60 MG  2. dalfampridine 10 MG   3. gabapentin (NEURONTIN) 600 MG   4. topiramate (TOPAMAX) 25 MG  5. amantadine (SYMMETREL) 100 MG   6. omeprazole (PRILOSEC) 40 MG capsule    ____ Fleet Enema (as directed)   __X__ Use CHG Soap (or wipes) as directed  ____ Use Benzoyl Peroxide Gel as instructed  ____ Use inhalers on the day of surgery  ____ Stop metformin 2 days prior to surgery    ____ Take 1/2 of usual insulin dose the night before surgery. No insulin the morning          of surgery.   ____ Call your PCP, cardiologist, or Pulmonologist if taking Coumadin/Plavix/aspirin and ask when to stop before your surgery.   __X__ One Week prior to surgery- Stop Anti-inflammatories such as Ibuprofen, Aleve, Advil, Motrin, meloxicam (MOBIC), diclofenac, etodolac, ketorolac, Toradol, Daypro, piroxicam, Goody's or BC powders. OK TO USE TYLENOL IF NEEDED   __X__ Stop supplements until after surgery.    ____ Bring C-Pap to the hospital.    If you have any questions  regarding your pre-procedure instructions,  Please call Pre-admit Testing at [phone]

## 2021-09-23 ENCOUNTER — Encounter
Admission: RE | Admit: 2021-09-23 | Discharge: 2021-09-23 | Disposition: A | Discharge status: Home / Self Care | Payer: 59 | Source: Ambulatory Visit | Attending: Obstetrics and Gynecology | Admitting: Obstetrics and Gynecology

## 2021-09-23 DIAGNOSIS — Z01818 Encounter for other preprocedural examination: Secondary | ICD-10-CM | POA: Diagnosis present

## 2021-09-23 DIAGNOSIS — I10 Essential (primary) hypertension: Secondary | ICD-10-CM | POA: Insufficient documentation

## 2021-09-23 DIAGNOSIS — E119 Type 2 diabetes mellitus without complications: Secondary | ICD-10-CM | POA: Diagnosis not present

## 2021-09-23 DIAGNOSIS — Z01812 Encounter for preprocedural laboratory examination: Secondary | ICD-10-CM

## 2021-09-23 LAB — CBC
HCT: 41.2 % (ref 36.0–46.0)
Hemoglobin: 12.8 g/dL (ref 12.0–15.0)
MCH: 27.2 pg (ref 26.0–34.0)
MCHC: 31.1 g/dL (ref 30.0–36.0)
MCV: 87.7 fL (ref 80.0–100.0)
Platelets: 276 10*3/uL (ref 150–400)
RBC: 4.7 MIL/uL (ref 3.87–5.11)
RDW: 14.7 % (ref 11.5–15.5)
WBC: 4 10*3/uL (ref 4.0–10.5)
nRBC: 0 % (ref 0.0–0.2)

## 2021-09-23 LAB — TYPE AND SCREEN
ABO/RH(D): O POS
Antibody Screen: NEGATIVE

## 2021-09-23 LAB — BASIC METABOLIC PANEL
Anion gap: 9 (ref 5–15)
BUN: 16 mg/dL (ref 6–20)
CO2: 29 mmol/L (ref 22–32)
Calcium: 9.1 mg/dL (ref 8.9–10.3)
Chloride: 103 mmol/L (ref 98–111)
Creatinine, Ser: 1.08 mg/dL — ABNORMAL HIGH (ref 0.44–1.00)
GFR, Estimated: >60 mL/min (ref >60)
Glucose, Bld: 89 mg/dL (ref 70–99)
Potassium: 3.3 mmol/L — ABNORMAL LOW (ref 3.5–5.1)
Sodium: 141 mmol/L (ref 135–145)

## 2021-09-27 ENCOUNTER — Ambulatory Visit: Payer: 59 | Admitting: Certified Registered"

## 2021-09-27 ENCOUNTER — Encounter
Admission: RE | Disposition: A | Discharge status: Home / Self Care | Payer: Self-pay | Source: Ambulatory Visit | Attending: Obstetrics and Gynecology

## 2021-09-27 ENCOUNTER — Encounter: Payer: Self-pay | Admitting: Obstetrics and Gynecology

## 2021-09-27 ENCOUNTER — Other Ambulatory Visit: Payer: Self-pay

## 2021-09-27 ENCOUNTER — Ambulatory Visit: Payer: 59 | Admitting: Urgent Care

## 2021-09-27 ENCOUNTER — Ambulatory Visit
Admission: RE | Admit: 2021-09-27 | Discharge: 2021-09-27 | Disposition: A | Discharge status: Home / Self Care | Payer: 59 | Source: Ambulatory Visit | Attending: Obstetrics and Gynecology | Admitting: Obstetrics and Gynecology

## 2021-09-27 DIAGNOSIS — Z01818 Encounter for other preprocedural examination: Secondary | ICD-10-CM

## 2021-09-27 DIAGNOSIS — Z01812 Encounter for preprocedural laboratory examination: Secondary | ICD-10-CM

## 2021-09-27 DIAGNOSIS — N950 Postmenopausal bleeding: Secondary | ICD-10-CM | POA: Diagnosis present

## 2021-09-27 DIAGNOSIS — N840 Polyp of corpus uteri: Secondary | ICD-10-CM | POA: Insufficient documentation

## 2021-09-27 DIAGNOSIS — E119 Type 2 diabetes mellitus without complications: Secondary | ICD-10-CM

## 2021-09-27 HISTORY — PX: DILATATION & CURETTAGE/HYSTEROSCOPY WITH MYOSURE: SHX6511

## 2021-09-27 LAB — GLUCOSE, CAPILLARY
Glucose-Capillary: 94 mg/dL (ref 70–99)
Glucose-Capillary: 97 mg/dL (ref 70–99)

## 2021-09-27 LAB — ABO/RH: ABO/RH(D): O POS

## 2021-09-27 SURGERY — DILATATION & CURETTAGE/HYSTEROSCOPY WITH MYOSURE
Anesthesia: General

## 2021-09-27 MED ORDER — FENTANYL CITRATE (PF) 100 MCG/2ML IJ SOLN
INTRAMUSCULAR | Status: AC
  Filled 2021-09-27: qty 2

## 2021-09-27 MED ORDER — KETOROLAC TROMETHAMINE 30 MG/ML IJ SOLN
INTRAMUSCULAR | Status: AC
  Filled 2021-09-27: qty 1

## 2021-09-27 MED ORDER — CEFAZOLIN SODIUM-DEXTROSE 2-4 GM/100ML-% IV SOLN
INTRAVENOUS | Status: AC
  Filled 2021-09-27: qty 100

## 2021-09-27 MED ORDER — MIDAZOLAM HCL 2 MG/2ML IJ SOLN
INTRAMUSCULAR | Status: DC | PRN
  Administered 2021-09-27: 2 mg via INTRAVENOUS

## 2021-09-27 MED ORDER — ONDANSETRON HCL 4 MG/2ML IJ SOLN
INTRAMUSCULAR | Status: AC
  Filled 2021-09-27: qty 2

## 2021-09-27 MED ORDER — OXYCODONE HCL 5 MG PO TABS: 5.0000 mg | ORAL_TABLET | Freq: Once | ORAL | Status: DC | PRN

## 2021-09-27 MED ORDER — ACETAMINOPHEN 500 MG PO TABS
ORAL_TABLET | ORAL | Status: AC
  Administered 2021-09-27: 1000 mg via ORAL
  Filled 2021-09-27: qty 2

## 2021-09-27 MED ORDER — DEXAMETHASONE SODIUM PHOSPHATE 10 MG/ML IJ SOLN
INTRAMUSCULAR | Status: AC
Start: 2021-09-27 — End: ?
  Filled 2021-09-27: qty 1

## 2021-09-27 MED ORDER — GABAPENTIN 300 MG PO CAPS: 300.0000 mg | ORAL_CAPSULE | ORAL | Status: DC

## 2021-09-27 MED ORDER — LIDOCAINE HCL (CARDIAC) PF 100 MG/5ML IV SOSY
PREFILLED_SYRINGE | INTRAVENOUS | Status: DC | PRN
  Administered 2021-09-27: 100 mg via INTRAVENOUS

## 2021-09-27 MED ORDER — GABAPENTIN 300 MG PO CAPS
ORAL_CAPSULE | ORAL | Status: AC
  Filled 2021-09-27: qty 1

## 2021-09-27 MED ORDER — POVIDONE-IODINE 10 % EX SWAB: 2.0000 | Freq: Once | CUTANEOUS | Status: DC

## 2021-09-27 MED ORDER — FENTANYL CITRATE (PF) 100 MCG/2ML IJ SOLN
INTRAMUSCULAR | Status: DC | PRN
  Administered 2021-09-27: 50 ug via INTRAVENOUS

## 2021-09-27 MED ORDER — CEFAZOLIN SODIUM-DEXTROSE 2-4 GM/100ML-% IV SOLN
2.0000 g | Freq: Once | INTRAVENOUS | Status: AC
  Administered 2021-09-27: 2 g via INTRAVENOUS

## 2021-09-27 MED ORDER — 0.9 % SODIUM CHLORIDE (POUR BTL) OPTIME
TOPICAL | Status: DC | PRN
  Administered 2021-09-27: 500 mL

## 2021-09-27 MED ORDER — CHLORHEXIDINE GLUCONATE 0.12 % MT SOLN: 15.0000 mL | Freq: Once | OROMUCOSAL | Status: AC

## 2021-09-27 MED ORDER — CHLORHEXIDINE GLUCONATE 0.12 % MT SOLN
OROMUCOSAL | Status: AC
  Administered 2021-09-27: 15 mL via OROMUCOSAL
  Filled 2021-09-27: qty 15

## 2021-09-27 MED ORDER — MIDAZOLAM HCL 2 MG/2ML IJ SOLN
INTRAMUSCULAR | Status: AC
  Filled 2021-09-27: qty 2

## 2021-09-27 MED ORDER — OXYCODONE HCL 5 MG/5ML PO SOLN: 5.0000 mg | Freq: Once | ORAL | Status: DC | PRN

## 2021-09-27 MED ORDER — ONDANSETRON HCL 4 MG/2ML IJ SOLN
INTRAMUSCULAR | Status: DC | PRN
  Administered 2021-09-27: 4 mg via INTRAVENOUS

## 2021-09-27 MED ORDER — FENTANYL CITRATE (PF) 100 MCG/2ML IJ SOLN
25.0000 ug | INTRAMUSCULAR | Status: DC | PRN
  Administered 2021-09-27: 25 ug via INTRAVENOUS

## 2021-09-27 MED ORDER — SODIUM CHLORIDE 0.9 % IV SOLN: INTRAVENOUS | Status: DC

## 2021-09-27 MED ORDER — ACETAMINOPHEN 500 MG PO TABS: 1000.0000 mg | ORAL_TABLET | ORAL | Status: AC

## 2021-09-27 MED ORDER — LACTATED RINGERS IV SOLN: INTRAVENOUS | Status: DC

## 2021-09-27 MED ORDER — SILVER NITRATE-POT NITRATE 75-25 % EX MISC
CUTANEOUS | Status: DC | PRN
  Administered 2021-09-27: 1

## 2021-09-27 MED ORDER — SODIUM CHLORIDE 0.9 % IR SOLN
Status: DC | PRN
  Administered 2021-09-27: 3000 mL

## 2021-09-27 MED ORDER — ORAL CARE MOUTH RINSE: 15.0000 mL | Freq: Once | OROMUCOSAL | Status: AC

## 2021-09-27 MED ORDER — KETOROLAC TROMETHAMINE 30 MG/ML IJ SOLN
INTRAMUSCULAR | Status: DC | PRN
  Administered 2021-09-27: 15 mg via INTRAVENOUS

## 2021-09-27 MED ORDER — DEXAMETHASONE SODIUM PHOSPHATE 10 MG/ML IJ SOLN
INTRAMUSCULAR | Status: DC | PRN
  Administered 2021-09-27: 10 mg via INTRAVENOUS

## 2021-09-27 MED ORDER — PROPOFOL 10 MG/ML IV BOLUS
INTRAVENOUS | Status: DC | PRN
  Administered 2021-09-27: 160 mg via INTRAVENOUS

## 2021-09-27 SURGICAL SUPPLY — 26 items
BAG PRESSURE INF REUSE 1000 (BAG) ×2 IMPLANT
DEVICE MYOSURE LITE (MISCELLANEOUS) IMPLANT
DEVICE MYOSURE REACH (MISCELLANEOUS) IMPLANT
DRSG TELFA 3X8 NADH (GAUZE/BANDAGES/DRESSINGS) ×2 IMPLANT
GLOVE SURG SYN 8.0 (GLOVE) ×2 IMPLANT
GLOVE SURG SYN 8.0 PF PI (GLOVE) ×1 IMPLANT
GOWN STRL REUS W/ TWL LRG LVL3 (GOWN DISPOSABLE) ×1 IMPLANT
GOWN STRL REUS W/ TWL XL LVL3 (GOWN DISPOSABLE) ×1 IMPLANT
GOWN STRL REUS W/TWL LRG LVL3 (GOWN DISPOSABLE) ×2
GOWN STRL REUS W/TWL XL LVL3 (GOWN DISPOSABLE) ×2
IV NS 1000ML (IV SOLUTION) ×2
IV NS 1000ML BAXH (IV SOLUTION) ×1 IMPLANT
IV NS IRRIG 3000ML ARTHROMATIC (IV SOLUTION) ×2 IMPLANT
KIT PROCEDURE FLUENT (KITS) IMPLANT
KIT TURNOVER CYSTO (KITS) ×2 IMPLANT
MANIFOLD NEPTUNE II (INSTRUMENTS) ×2 IMPLANT
PACK DNC HYST (MISCELLANEOUS) IMPLANT
PAD DRESSING TELFA 3X8 NADH (GAUZE/BANDAGES/DRESSINGS) ×1 IMPLANT
PAD OB MATERNITY 4.3X12.25 (PERSONAL CARE ITEMS) ×2 IMPLANT
PAD PREP 24X41 OB/GYN DISP (PERSONAL CARE ITEMS) ×2 IMPLANT
SCRUB CHG 4% DYNA-HEX 4OZ (MISCELLANEOUS) ×2 IMPLANT
SEAL ROD LENS SCOPE MYOSURE (ABLATOR) ×2 IMPLANT
SET CYSTO W/LG BORE CLAMP LF (SET/KITS/TRAYS/PACK) IMPLANT
TOWEL OR 17X26 4PK STRL BLUE (TOWEL DISPOSABLE) ×2 IMPLANT
TUBING CONNECTING 10 (TUBING) IMPLANT
WATER STERILE IRR 500ML POUR (IV SOLUTION) ×2 IMPLANT

## 2021-09-27 NOTE — Transfer of Care (Signed)
Immediate Anesthesia Transfer of Care Note  Patient: Taylor Wyatt  Procedure(s) Performed: FRACTIONAL DILATATION AND CURETTAGE Melton Krebs, POSSIBLE MYOSURE  Patient Location: PACU  Anesthesia Type:General  Level of Consciousness: awake  Airway & Oxygen Therapy: Patient Spontanous Breathing  Post-op Assessment: Report given to RN  Post vital signs: stable  Last Vitals:  Vitals Value Taken Time  BP 154/77 09/27/21 1249  Temp    Pulse 58 09/27/21 1250  Resp 12 09/27/21 1250  SpO2 93 % 09/27/21 1250  Vitals shown include unvalidated device data.  Last Pain:  Vitals:   09/27/21 1050  TempSrc: Temporal  PainSc: 0-No pain         Complications: No notable events documented.

## 2021-09-27 NOTE — Anesthesia Preprocedure Evaluation (Signed)
Anesthesia Evaluation  Patient identified by MRN, date of birth, ID band Patient awake    Reviewed: Allergy & Precautions, NPO status , Patient's Chart, lab work & pertinent test results  History of Anesthesia Complications Negative for: history of anesthetic complications  Airway Mallampati: III  TM Distance: >3 FB Neck ROM: full    Dental  (+) Lower Dentures, Upper Dentures, Missing   Pulmonary neg pulmonary ROS, neg shortness of breath, former smoker,    Pulmonary exam normal        Cardiovascular hypertension, (-) angina(-) CAD and (-) Past MI negative cardio ROS Normal cardiovascular exam     Neuro/Psych negative neurological ROS  negative psych ROS   GI/Hepatic negative GI ROS, Neg liver ROS,   Endo/Other  negative endocrine ROSdiabetes, Type 2  Renal/GU      Musculoskeletal   Abdominal   Peds  Hematology negative hematology ROS (+)   Anesthesia Other Findings Past Medical History: No date: Diabetes mellitus without complication (HCC) No date: Hypertension No date: Kidney stones No date: Multiple sclerosis (HCC)  Past Surgical History: 2015: CARPAL TUNNEL RELEASE; Left No date: CESAREAN SECTION No date: TUBAL LIGATION  BMI    Body Mass Index: 38.18 kg/m      Reproductive/Obstetrics negative OB ROS                             Anesthesia Physical Anesthesia Plan  ASA: 2  Anesthesia Plan: General/Spinal   Post-op Pain Management:    Induction: Intravenous  PONV Risk Score and Plan: 2 and Dexamethasone, Ondansetron, Midazolam and Treatment may vary due to age or medical condition  Airway Management Planned: LMA  Additional Equipment:   Intra-op Plan:   Post-operative Plan: Extubation in OR  Informed Consent: I have reviewed the patients History and Physical, chart, labs and discussed the procedure including the risks, benefits and alternatives for the proposed  anesthesia with the patient or authorized representative who has indicated his/her understanding and acceptance.     Dental Advisory Given  Plan Discussed with: Anesthesiologist, CRNA and Surgeon  Anesthesia Plan Comments: (Patient consented for risks of anesthesia including but not limited to:  - adverse reactions to medications - damage to eyes, teeth, lips or other oral mucosa - nerve damage due to positioning  - sore throat or hoarseness - Damage to heart, brain, nerves, lungs, other parts of body or loss of life  Patient voiced understanding.)        Anesthesia Quick Evaluation

## 2021-09-27 NOTE — Progress Notes (Signed)
Pt here for Fx D+C and  H/S and possible myosure resection  of polyp . Labs reviewed  Ready for sx . All questions  answered  Proceed

## 2021-09-27 NOTE — Progress Notes (Signed)
Patients teeth returned

## 2021-09-27 NOTE — Anesthesia Procedure Notes (Signed)
Procedure Name: LMA Insertion Date/Time: 09/27/2021 12:05 PM  Performed by: Katherine Basset, CRNAPre-anesthesia Checklist: Patient identified, Emergency Drugs available, Suction available and Patient being monitored Patient Re-evaluated:Patient Re-evaluated prior to induction Oxygen Delivery Method: Circle system utilized Preoxygenation: Pre-oxygenation with 100% oxygen Induction Type: IV induction Ventilation: Mask ventilation without difficulty and Oral airway inserted - appropriate to patient size LMA: LMA inserted LMA Size: 4.0 Number of attempts: 1 Placement Confirmation: positive ETCO2 and breath sounds checked- equal and bilateral Tube secured with: Tape Dental Injury: Teeth and Oropharynx as per pre-operative assessment

## 2021-09-27 NOTE — Discharge Instructions (Signed)

## 2021-09-27 NOTE — Brief Op Note (Signed)
09/27/2021  12:39 PM  PATIENT:  Taylor Wyatt  51 y.o. female  PRE-OPERATIVE DIAGNOSIS:  postmenopausal bleeding  POST-OPERATIVE DIAGNOSIS:same as above *  PROCEDURE:Fractional D+C , hysteroscopy  SURGEON:  Surgeon(s) and Role:    * Lismary Kiehn, Ihor Austin, MD - Primary  PHYSICIAN ASSISTANT: CST  ASSISTANTS: none   ANESTHESIA:   general  EBL:  2 mL IOF 500 cc uo 50 cc  BLOOD ADMINISTERED:none  DRAINS: none   LOCAL MEDICATIONS USED:  NONE  SPECIMEN:  Source of Specimen:  ecc and endometrial curetting   DISPOSITION OF SPECIMEN:  PATHOLOGY  COUNTS:  YES  TOURNIQUET:  * No tourniquets in log *  DICTATION: .Other Dictation: Dictation Number verbal  PLAN OF CARE: Discharge to home after PACU  PATIENT DISPOSITION:  PACU - hemodynamically stable.   Delay start of Pharmacological VTE agent (>24hrs) due to surgical blood loss or risk of bleeding: not applicable

## 2021-09-27 NOTE — Anesthesia Postprocedure Evaluation (Signed)
Anesthesia Post Note  Patient: Taylor Wyatt  Procedure(s) Performed: FRACTIONAL DILATATION AND CURETTAGE /HYSTEROSCOPY  Patient location during evaluation: PACU Anesthesia Type: Combined General/Spinal Level of consciousness: awake and alert Pain management: pain level controlled Vital Signs Assessment: post-procedure vital signs reviewed and stable Respiratory status: spontaneous breathing, nonlabored ventilation, respiratory function stable and patient connected to nasal cannula oxygen Cardiovascular status: blood pressure returned to baseline and stable Postop Assessment: no apparent nausea or vomiting Anesthetic complications: no   No notable events documented.   Last Vitals:  Vitals:   09/27/21 1324 09/27/21 1334  BP:  (!) 146/65  Pulse: (!) 59 63  Resp: 14 18  Temp: (!) 36.1 C (!) 36.3 C  SpO2: 95% 98%    Last Pain:  Vitals:   09/27/21 1334  TempSrc: Temporal  PainSc: 0-No pain                 Stephanie Coup

## 2021-09-27 NOTE — Op Note (Signed)
NAMECARINNA, NEWHART MEDICAL RECORD NO: [id number] ACCOUNT NO: [id number] DATE OF BIRTH: 02/18/1971 FACILITY: [facility] LOCATION: [facility] PHYSICIAN: Suzy Bouchard, MD  Operative Report   DATE OF PROCEDURE: 09/27/2021  PREOPERATIVE DIAGNOSIS:  Postmenopausal bleeding.  POSTOPERATIVE DIAGNOSIS:  Postmenopausal bleeding.  PROCEDURE: 1.  Fractional dilation and curettage. 2.  Hysteroscopy.  SURGEON:  Suzy Bouchard, MD   ANESTHESIA:  General endotracheal anesthesia.  INDICATIONS:  A 51 year old female with postmenopausal bleeding.  Endometrial biopsy in the office showed a fragment of benign endometrial polyp.  Saline infusion sonohysterography showed a possible endometrial polyp measuring 1.5 x 1.1 cm.  DESCRIPTION OF PROCEDURE:  After adequate general endotracheal anesthesia, the patient was placed in dorsal supine position with the legs placed in the candy cane stirrups.  The patient's abdomen, perineum and vagina were prepped and draped in normal  sterile fashion.  The patient did receive 2 grams of IV Ancef for surgical prophylaxis.  Timeout was performed.  Straight catheterization of the bladder yielded 50 mL dark urine.  Weighted speculum was placed in the posterior vaginal vault and the  anterior cervix was grasped with a single tooth tenaculum.  Endocervical curettage was performed followed by dilation of the cervix #16 Hanks dilator.  Hysteroscope was advanced into the endometrial cavity and revealed a fluffy endometrial cavity;  however, no definitive polyps were identified.  Hysteroscope was removed and the cervix was then dilated to #19 Hanks dilator and a sharp curettage was performed with good uterine cry and adequate tissue removed.  Single tooth tenaculum was removed.   Silver nitrate was used at the [place] sites and procedure was terminated.  There were no complications.  ESTIMATED BLOOD LOSS:  Minimal.  INTRAOPERATIVE FLUIDS:  500 mL  URINE  OUTPUT:  50 mL.  The patient was taken to recovery room in good condition.   PUS D: 09/27/2021 12:50:58 pm T: 09/27/2021 2:27:00 pm  JOB: [id number]/ [id number]

## 2021-09-28 ENCOUNTER — Encounter: Payer: Self-pay | Admitting: Obstetrics and Gynecology

## 2021-09-30 LAB — SURGICAL PATHOLOGY
